# Patient Record
Sex: Female | Born: 2000 | Race: White | Hispanic: No | Marital: Single | Smoking: Never smoker
Health system: Southern US, Community
[De-identification: ages and names within clinical notes are randomized; demographics above are authoritative.]

---
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
## 2019-06-24 IMAGING — CT CT ABD-PELV W/ CM
2 of 4 series · 17 of 46 positions shown, 19 images · IV contrast (omnipaque)
Comparison: None.

CLINICAL DATA: Acute right lower quadrant abdominal pain.

EXAM:
CT ABDOMEN AND PELVIS WITH CONTRAST
TECHNIQUE: Multidetector CT imaging of the abdomen and pelvis was performed
using the standard protocol following bolus administration of
intravenous contrast.
CONTRAST:  100mL OMNIPAQUE IOHEXOL 300 MG/ML  SOLN

[Series 3: a/p w/ 5mm · axial · 0.75mm/px · z∈[+580,+1005]mm · 14 of 93 slices shown, 16 images]
[im 4/93  soft-tissue]
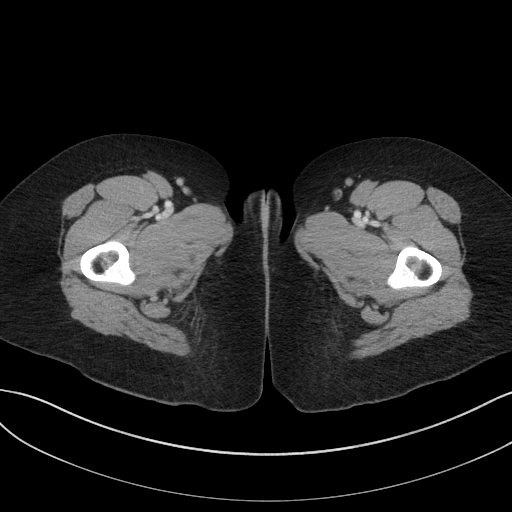
[im 4/93  bone]
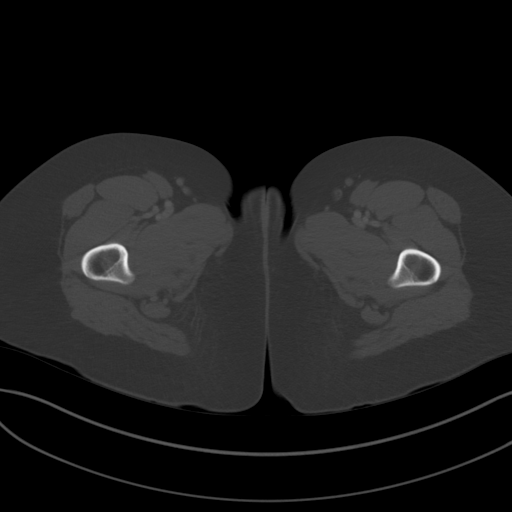
[im 11/93  soft-tissue]
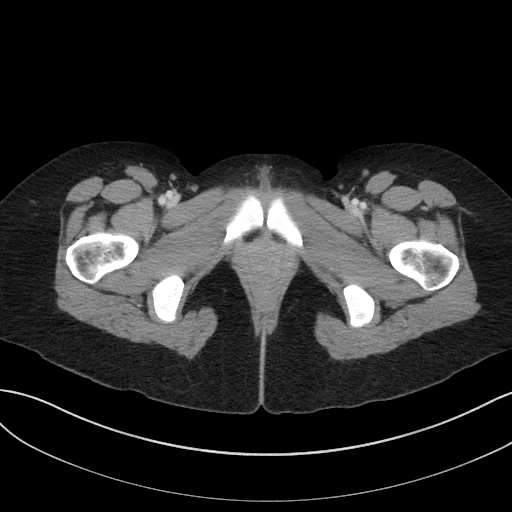
[im 18/93  soft-tissue]
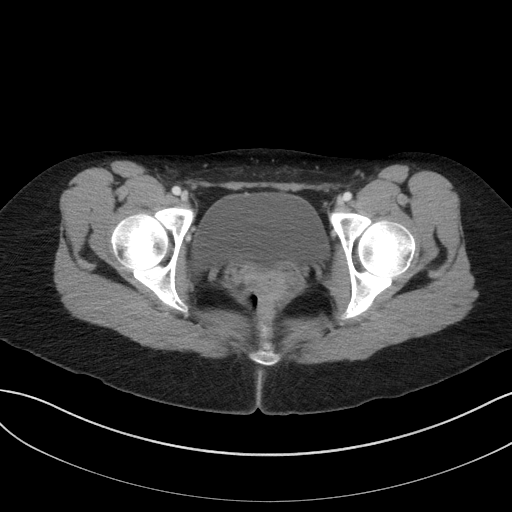
[im 25/93  soft-tissue]
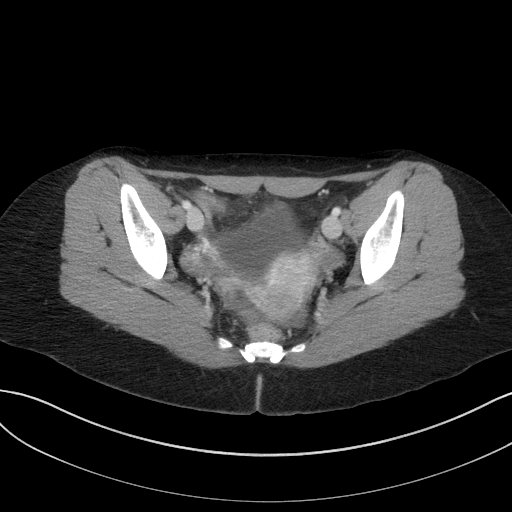
[im 32/93  soft-tissue]
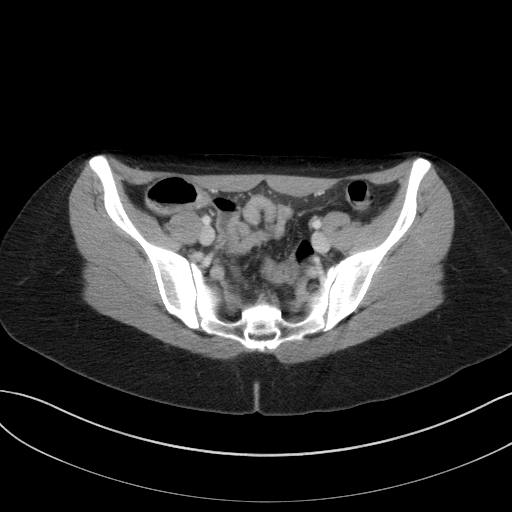
[im 36/93  soft-tissue]
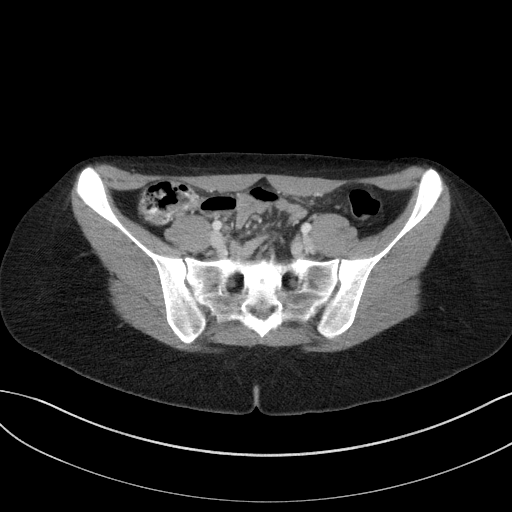
[im 43/93  soft-tissue]
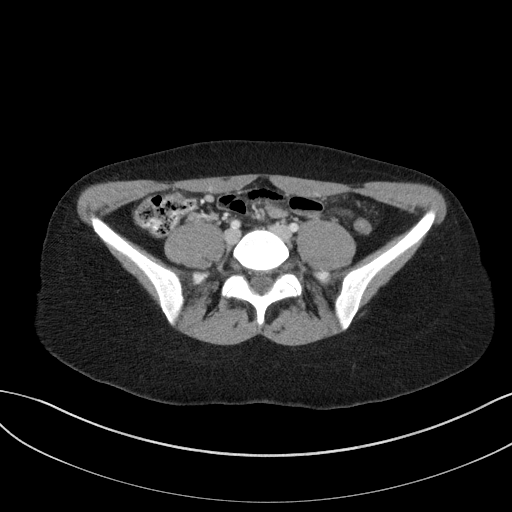
[im 50/93  soft-tissue]
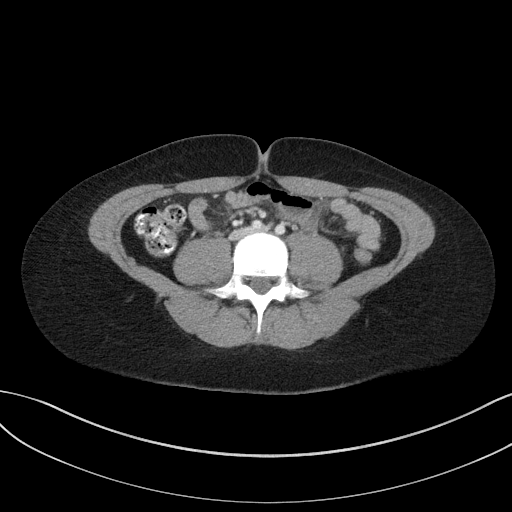
[im 57/93  soft-tissue]
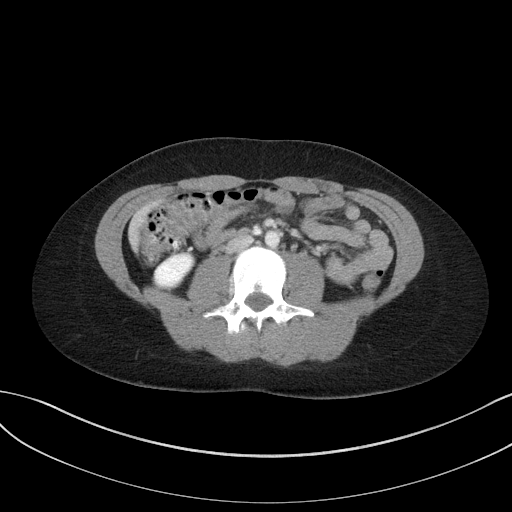
[im 57/93  bone]
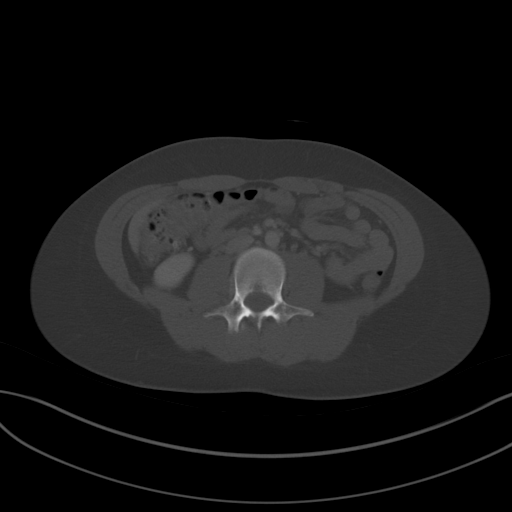
[im 61/93  soft-tissue]
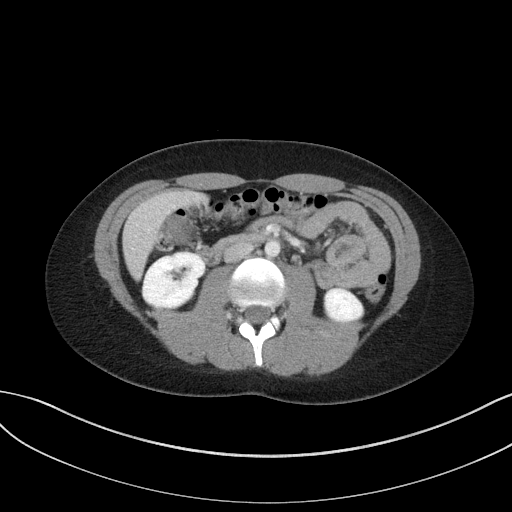
[im 68/93  soft-tissue]
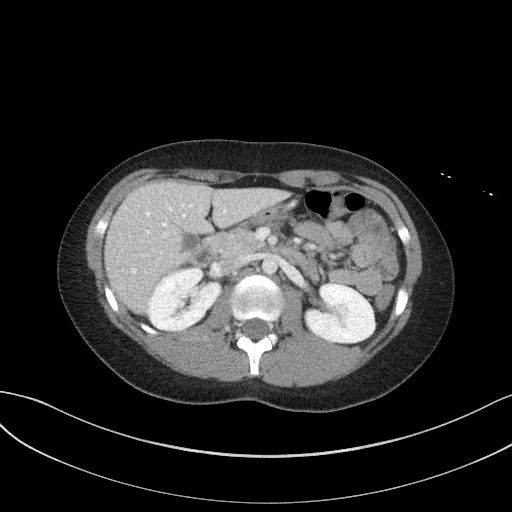
[im 75/93  soft-tissue]
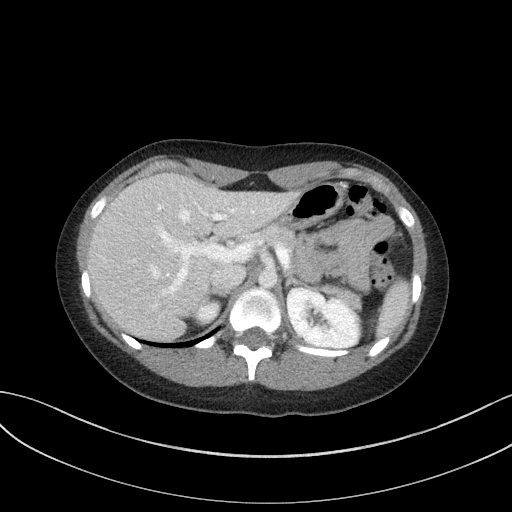
[im 82/93  soft-tissue]
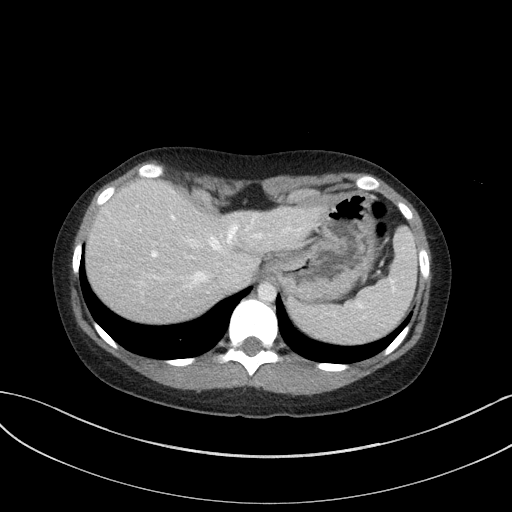
[im 89/93  soft-tissue]
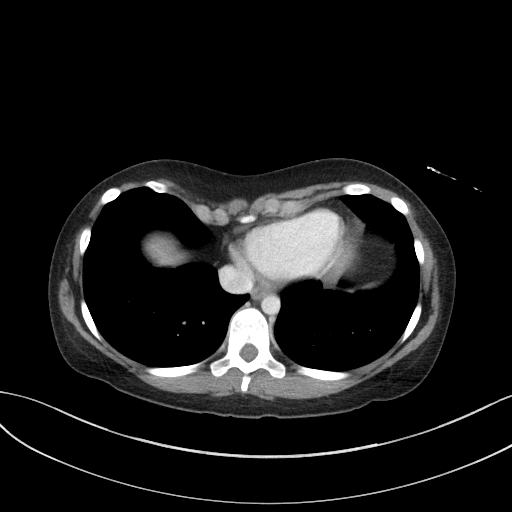

[Series 6: a/p w/ cor · coronal · 0.88mm/px · 3 of 118 slices shown]
[im 40/118  soft-tissue]
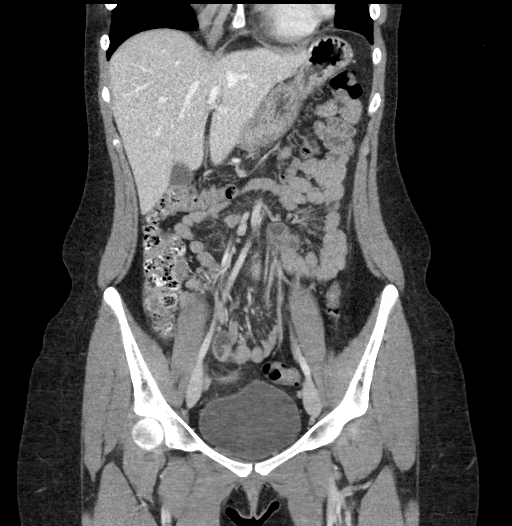
[im 53/118  soft-tissue]
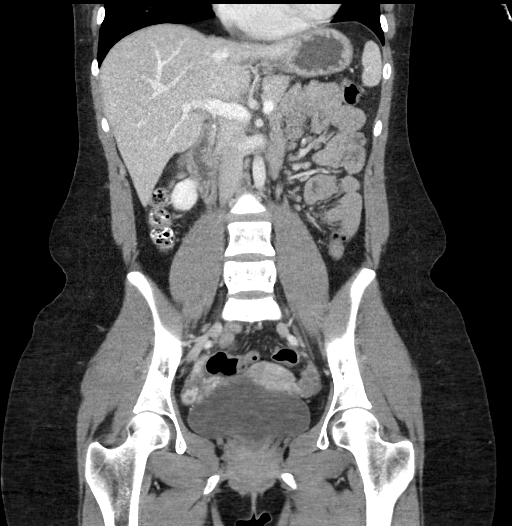
[im 66/118  soft-tissue]
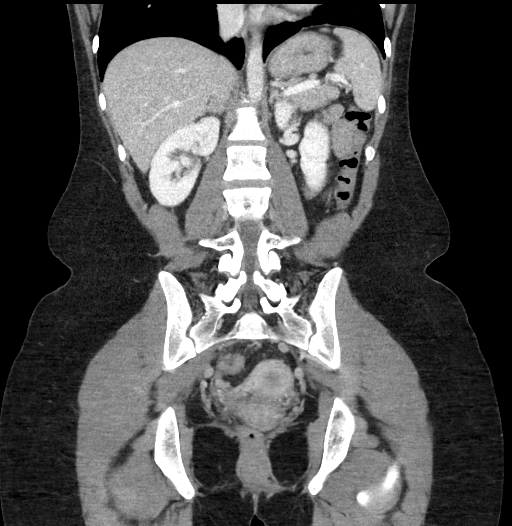

[17 of 46 positions shown; findings below may reference images not displayed]

FINDINGS: Lower chest: No acute abnormality.

Hepatobiliary: No focal liver abnormality is seen. No gallstones,
gallbladder wall thickening, or biliary dilatation.

Pancreas: Unremarkable. No pancreatic ductal dilatation or
surrounding inflammatory changes.

Spleen: Normal in size without focal abnormality.

Adrenals/Urinary Tract: Adrenal glands are unremarkable. Kidneys are
normal, without renal calculi, focal lesion, or hydronephrosis.
Bladder is unremarkable.

Stomach/Bowel: The stomach appears normal. There is no evidence of
bowel obstruction. The appendix is enlarged with surrounding
inflammation consistent with appendicitis.

Appendix: Location: Inferior and medial to cecum.

Diameter: 12 mm.

Appendicolith: No.

Mucosal hyper-enhancement: Yes.

Extraluminal gas: No.

Periappendiceal collection: No.

Vascular/Lymphatic: No significant vascular findings are present. No
enlarged abdominal or pelvic lymph nodes.

Reproductive: Uterus and bilateral adnexa are unremarkable.

Other: No abdominal wall hernia or abnormality. No abdominopelvic
ascites.

Musculoskeletal: No acute or significant osseous findings.
IMPRESSION: Findings consistent with acute appendicitis. No abscess or evidence
of perforation is noted.
